# Patient Record
Sex: Male | Born: 1975 | Race: White | Hispanic: No | Marital: Married | Smoking: Former smoker
Health system: Southern US, Community
[De-identification: ages and names within clinical notes are randomized; demographics above are authoritative.]

## PROBLEM LIST (undated history)

## (undated) ENCOUNTER — Ambulatory Visit: Admission: EM | Payer: BC Managed Care – PPO | Source: Home / Self Care

## (undated) HISTORY — PX: NASAL SINUS SURGERY: SHX719

## (undated) HISTORY — PX: KNEE SURGERY: SHX244

---

## 2007-03-18 ENCOUNTER — Ambulatory Visit: Payer: Self-pay | Admitting: Emergency Medicine

## 2007-03-28 ENCOUNTER — Ambulatory Visit: Payer: Self-pay | Admitting: Emergency Medicine

## 2007-10-24 ENCOUNTER — Ambulatory Visit: Payer: Self-pay | Admitting: Internal Medicine

## 2007-10-25 ENCOUNTER — Ambulatory Visit: Payer: Self-pay | Admitting: Internal Medicine

## 2007-12-26 ENCOUNTER — Other Ambulatory Visit: Payer: Self-pay

## 2007-12-26 ENCOUNTER — Ambulatory Visit: Payer: Self-pay | Admitting: Family Medicine

## 2008-02-17 ENCOUNTER — Ambulatory Visit: Payer: Self-pay | Admitting: Family Medicine

## 2008-05-20 IMAGING — CR DG CHEST 2V
1 series · 2 of 2 positions shown · non-contrast
Comparison: none

REASON FOR EXAM: chest pain
COMMENTS:

[Series 1: view not recorded · 0.17mm/px · 2 of 2 slices shown]
[im 1/2]
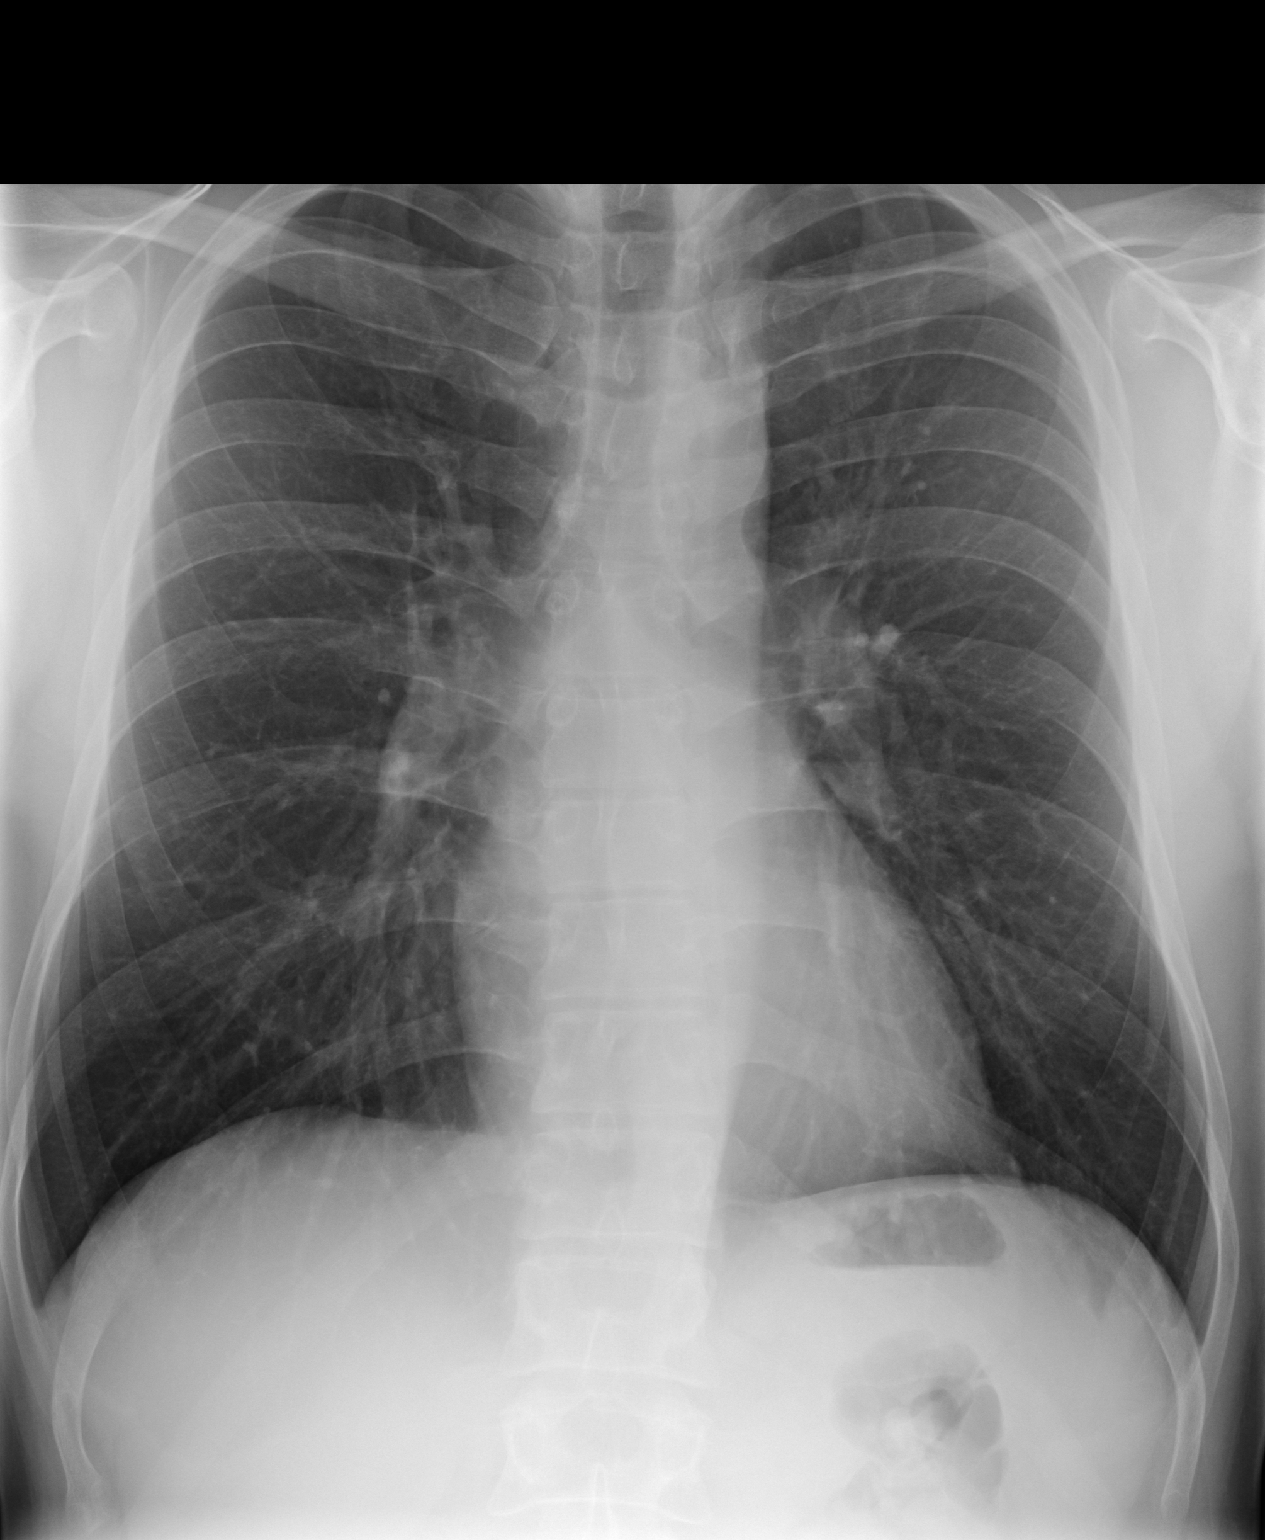
[im 2/2]
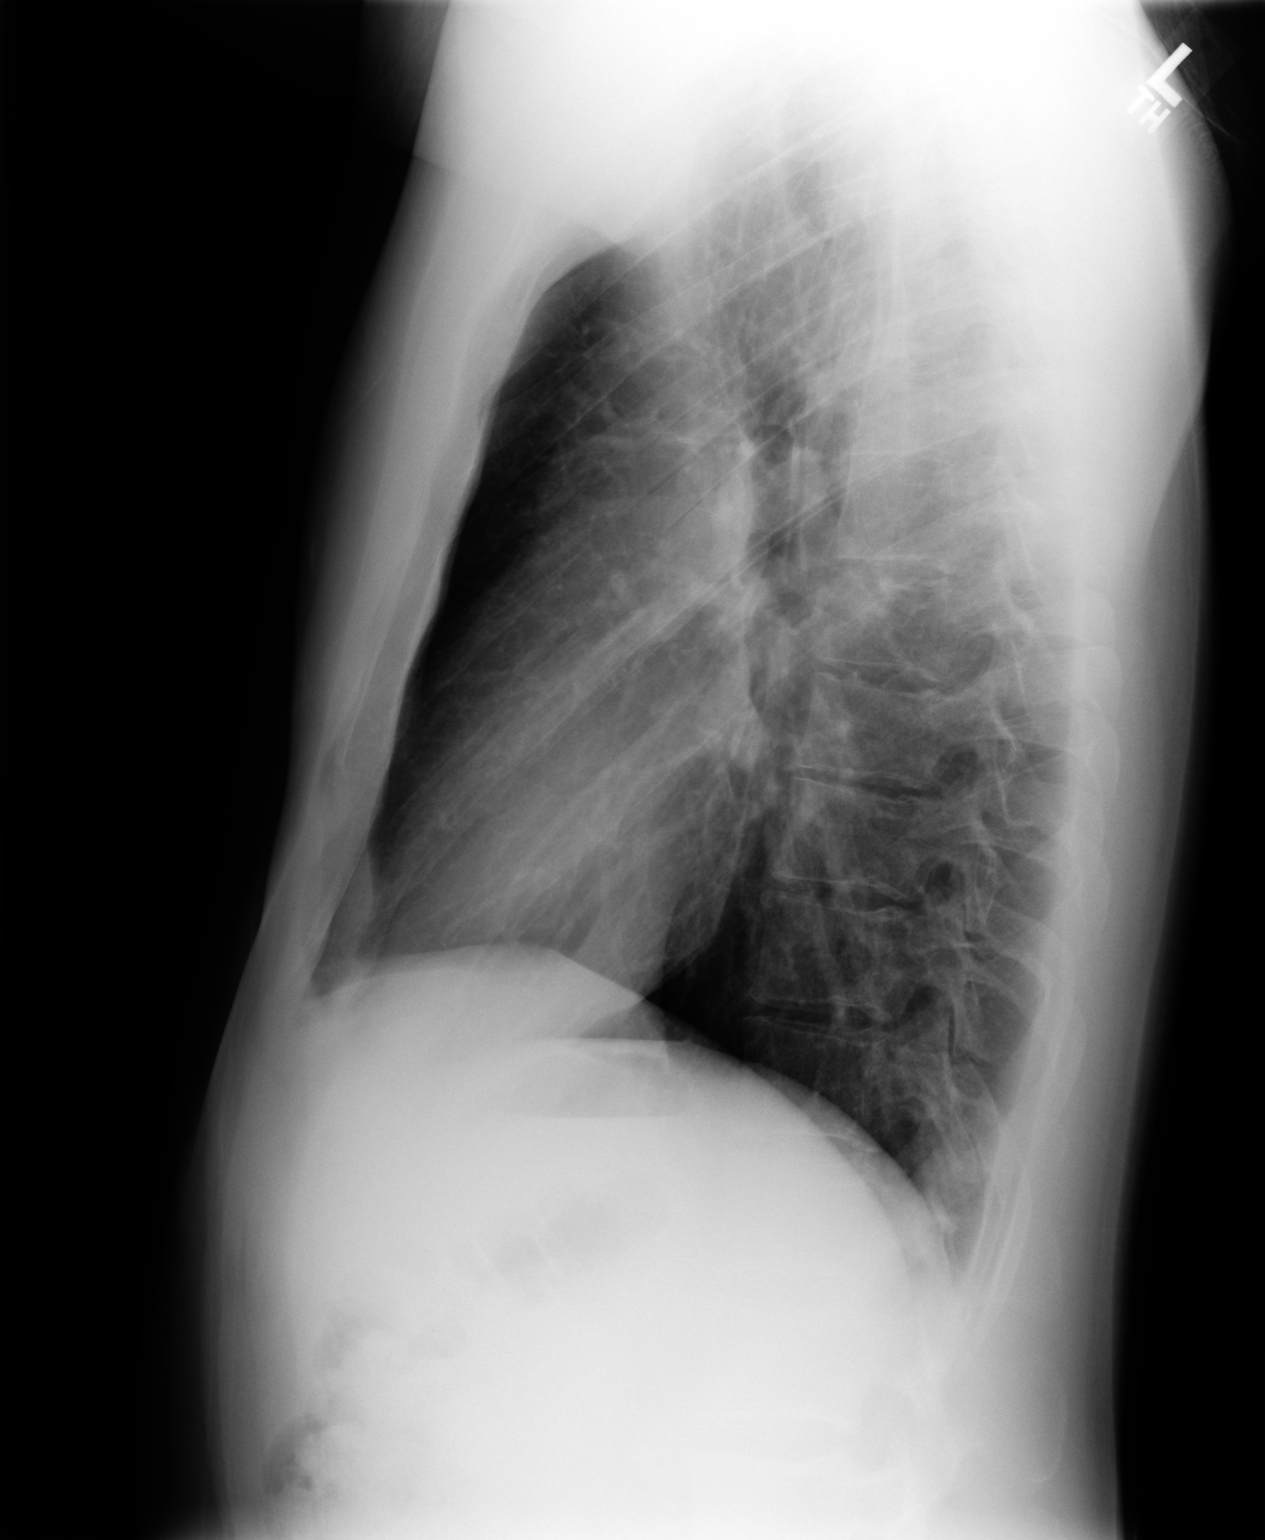

[2 of 2 positions shown; findings below may reference images not displayed]

PROCEDURE:     MDR - MDR CHEST PA(OR AP) AND LATERAL  - December 26, 2007  [DATE]

RESULT:     Comparison is made to the prior exam of 03/28/2007. No pneumonia,
pneumothorax or pleural effusion is seen. The heart size is normal. The
mediastinal and osseous structures show no significant abnormalities.
IMPRESSION: No significant abnormalities are noted.

## 2008-10-09 ENCOUNTER — Ambulatory Visit: Payer: Self-pay | Admitting: Urology

## 2009-01-22 IMAGING — CT CT ABD-PELV W/O CM
1 of 2 series · 15 of 32 positions shown, 19 images · non-contrast
Comparison: none

REASON FOR EXAM: abdominal pain
COMMENTS:

[Series 2: soft tissue · axial · 0.77mm/px · z∈[-252,+195]mm · 15 of 165 slices shown, 19 images]
[im 8/165  soft-tissue]
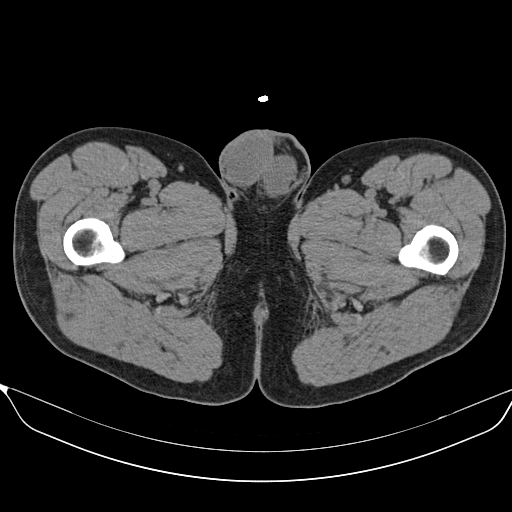
[im 8/165  bone]
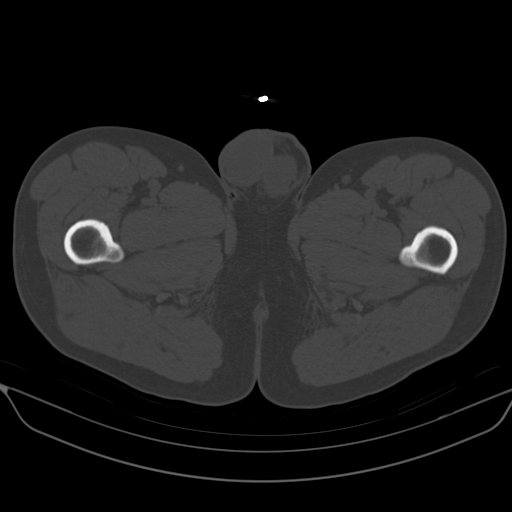
[im 22/165  soft-tissue]
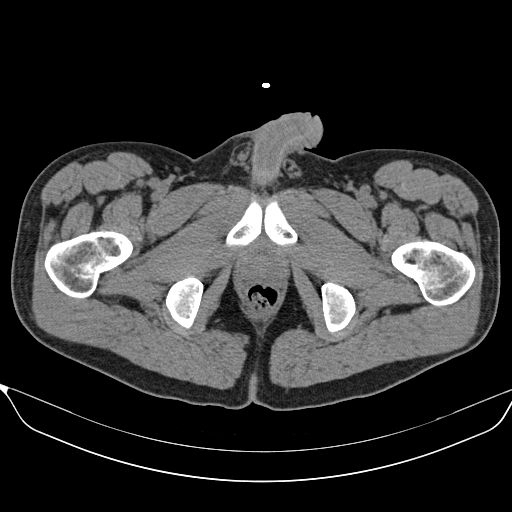
[im 36/165  soft-tissue]
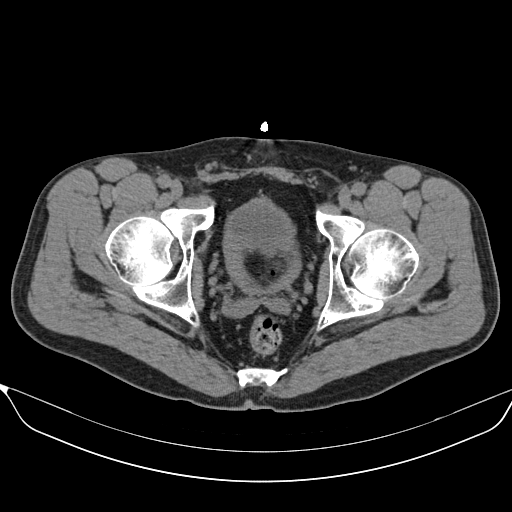
[im 43/165  soft-tissue]
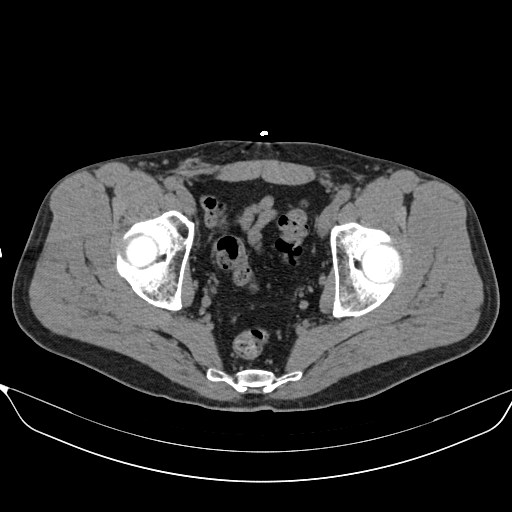
[im 58/165  soft-tissue]
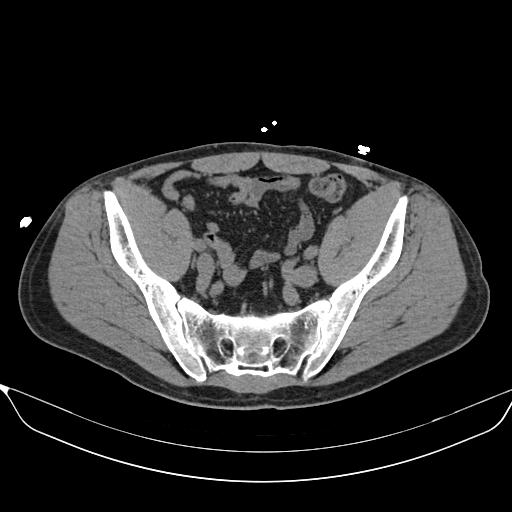
[im 72/165  soft-tissue]
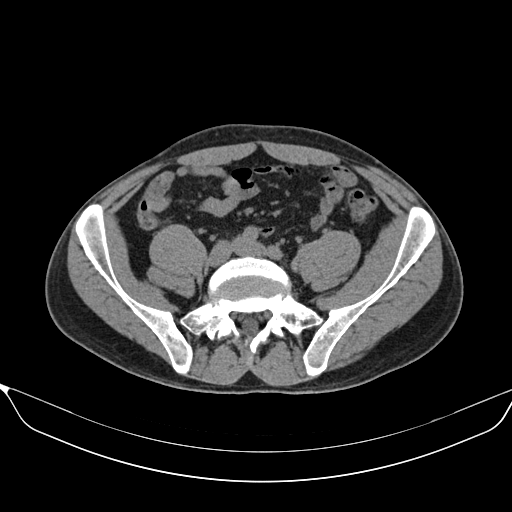
[im 86/165  soft-tissue]
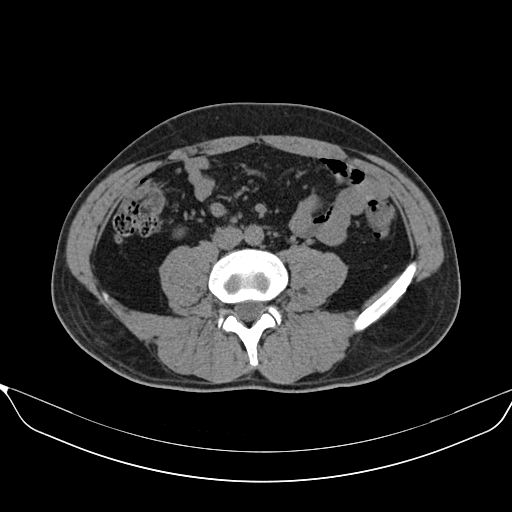
[im 93/165  soft-tissue]
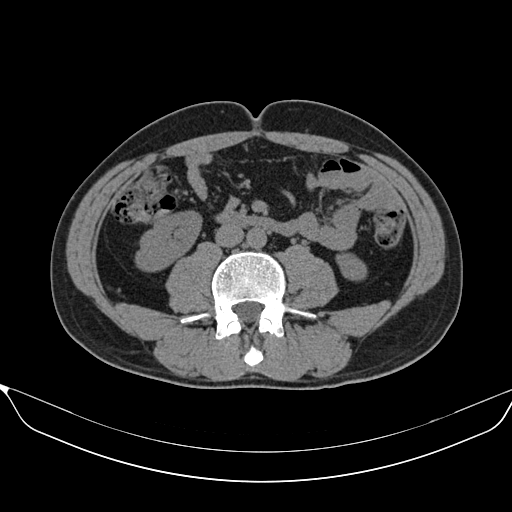
[im 107/165  soft-tissue]
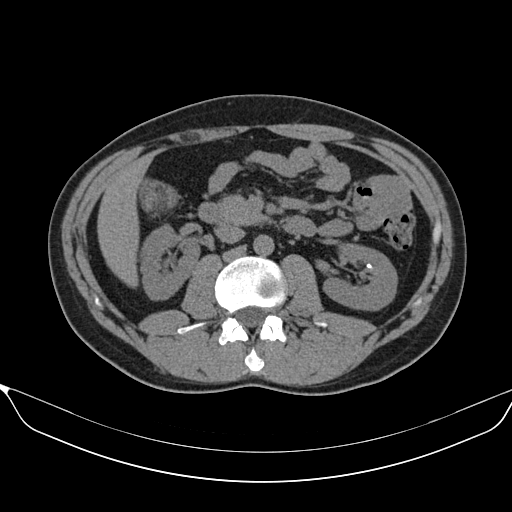
[im 107/165  bone]
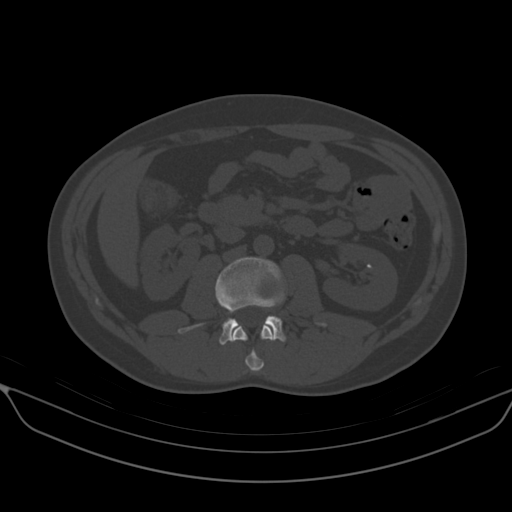
[im 122/165  soft-tissue]
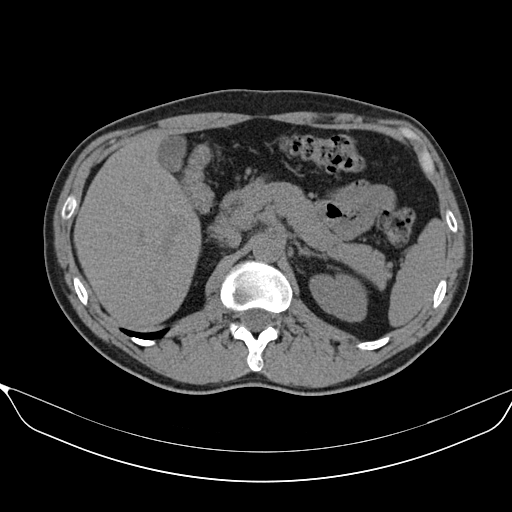
[im 129/165  soft-tissue]
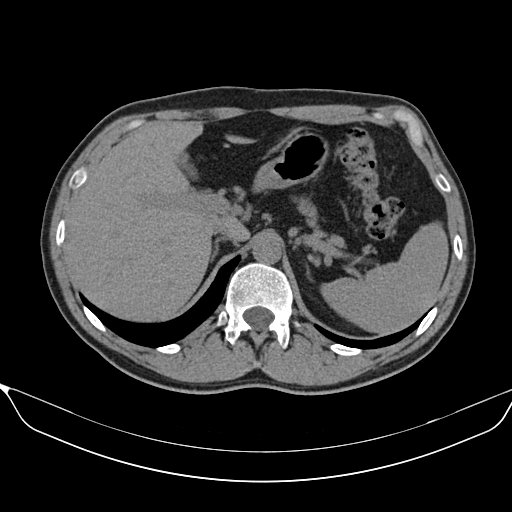
[im 136/165  lung]
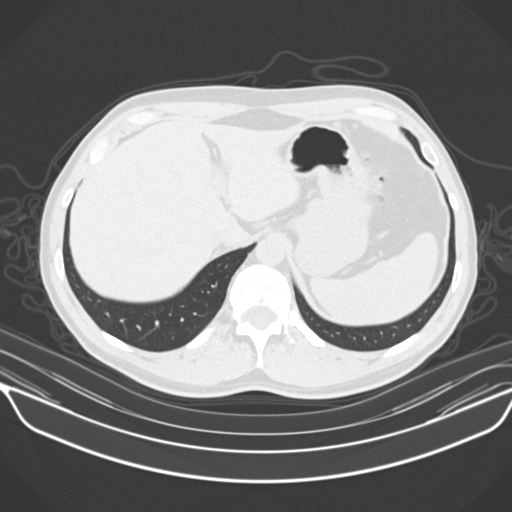
[im 143/165  soft-tissue]
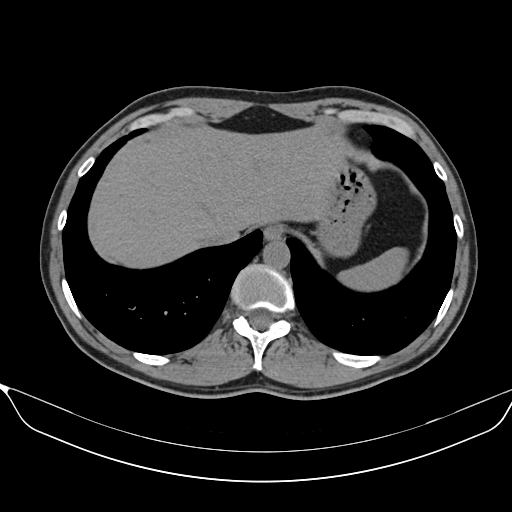
[im 143/165  lung]
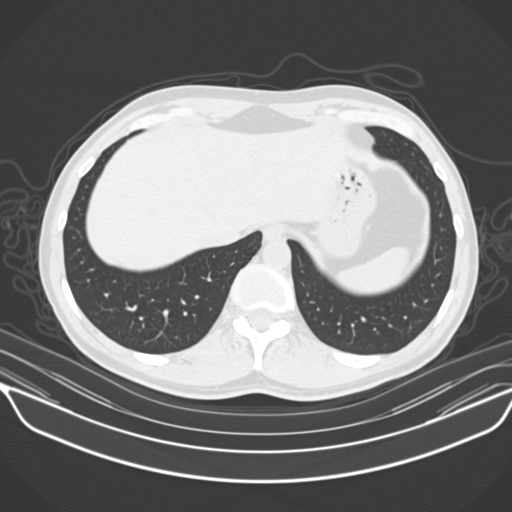
[im 150/165  lung]
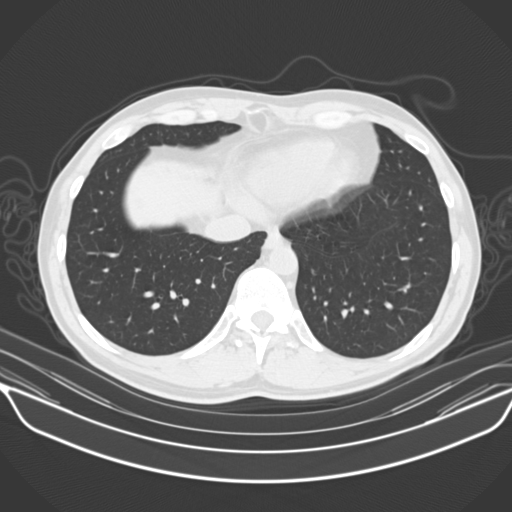
[im 157/165  soft-tissue]
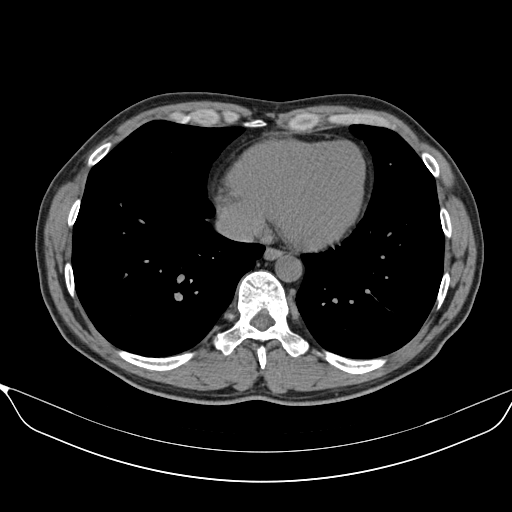
[im 157/165  lung]
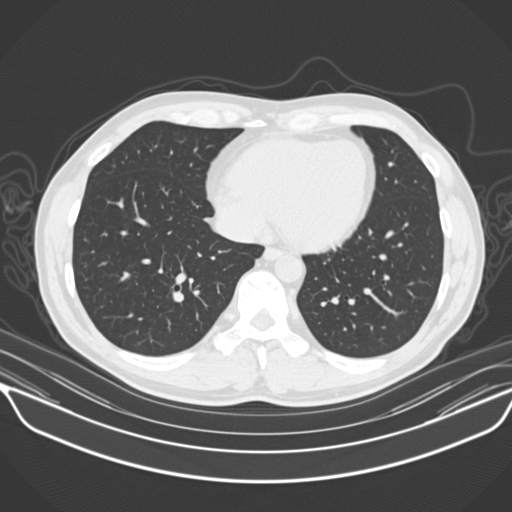

[15 of 32 positions shown; findings below may reference images not displayed]

PROCEDURE:     CEOLA - CEOLA ABDOMEN AND PELVIS WO  - October 09, 2008  [DATE]

RESULT:     The patient is undergoing evaluation for LEFT flank and pelvic
discomfort. Non-contrast CT scanning was performed through the abdomen and
pelvis at 3 mm intervals and slice thicknesses at soft tissue and lung
windows. Review of three dimensional reconstructed images was performed
separately on the Webspace server monitor. A comparison study from 25 October, 2007 is reviewed. On the prior study the patient was found to have
kidney stones on the LEFT and had a distal ureteral calculus at that time.
On today's study in the upper pole of the LEFT kidney there is an
approximately 2 mm diameter stone. In the midpole there is an approximately
4 mm diameter stone and in the lower pole there is an approximately 5 mm
diameter stone with an adjacent approximately 2 mm diameter stone. There is
no evidence of obstruction of the LEFT renal collecting system currently. No
ureteral stone is seen. On the RIGHT, there is a punctate lower pole
nonobstructing stone identified. Along the expected course of the ureters I
see no abnormal soft tissue masses. The urinary bladder is partially
distended and grossly normal.

Within the inguinal region I see no hernia. The sigmoid colon and rectum are
normal in appearance for the noncontrast study. The prostate gland and
seminal vesicles are normal in appearance for age. No pelvic nor inguinal
lymphadenopathy is identified.

The liver, gallbladder, spleen, stomach, pancreas, and adrenal glands are
normal in appearance for the noncontrast study. The caliber of the abdominal
aorta is normal. The lung bases are clear. The lumbar vertebral bodies are
preserved in height.
IMPRESSION: 1. There are bilateral kidney stones. There is no evidence of obstruction
currently. The perinephric fat is normal in density.
2. I do not see evidence of diverticulitis or other acute bowel abnormality.
There is no evidence of an inguinal hernia.
3. I see no abnormality elsewhere within the abdomen or pelvis.

## 2009-04-07 ENCOUNTER — Ambulatory Visit: Payer: Self-pay | Admitting: Internal Medicine

## 2009-10-19 ENCOUNTER — Ambulatory Visit: Payer: Self-pay | Admitting: Internal Medicine

## 2015-10-20 ENCOUNTER — Ambulatory Visit
Admission: EM | Admit: 2015-10-20 | Discharge: 2015-10-20 | Disposition: A | Discharge status: Home / Self Care | Payer: 59 | Attending: Family Medicine | Admitting: Family Medicine

## 2015-10-20 ENCOUNTER — Encounter: Payer: Self-pay | Admitting: Emergency Medicine

## 2015-10-20 DIAGNOSIS — R490 Dysphonia: Secondary | ICD-10-CM

## 2015-10-20 DIAGNOSIS — H6981 Other specified disorders of Eustachian tube, right ear: Secondary | ICD-10-CM

## 2015-10-20 LAB — RAPID STREP SCREEN (MED CTR MEBANE ONLY): Streptococcus, Group A Screen (Direct): NEGATIVE

## 2015-10-20 MED ORDER — AMOXICILLIN-POT CLAVULANATE 875-125 MG PO TABS: 1.0000 | ORAL_TABLET | Freq: Two times a day (BID) | ORAL | Status: DC

## 2015-10-20 NOTE — ED Provider Notes (Signed)
CSN: [id number]     Arrival date & time 10/20/15  1010 History   First MD Initiated Contact with Patient 10/20/15 1233   Nurses notes were reviewed.  Chief Complaint  Patient presents with  . Otalgia  . Sore Throat    Patient reports pain right ear and pain in his right side of his throat as well. States he's had sinus surgery was more worse some is that he states that he's had difficulty with hoarseness for over 2 months now. He saw his PCP several weeks ago placed him on a nasal spray. He does not know the name of the nasal spray. He is allergic to Sudafed. And antihistamines just dried him up which is not unexpected.  (Consider location/radiation/quality/duration/timing/severity/associated sxs/prior Treatment) Patient is a 40 y.o. male presenting with ear pain. The history is provided by the patient. No language interpreter was used.  Otalgia Location:  Right Behind ear:  No abnormality Quality:  Aching and pressure Severity:  Moderate Onset quality:  Sudden Timing:  Constant Progression:  Worsening Chronicity:  New Context: foreign body   Context: not direct blow, not elevation change, not loud noise and no water in ear   Relieved by:  Nothing Worsened by:  Nothing tried Associated symptoms: no rhinorrhea and no sore throat     History reviewed. No pertinent past medical history. Past Surgical History  Procedure Laterality Date  . Nasal sinus surgery    . Knee surgery     History reviewed. No pertinent family history. Social History  Substance Use Topics  . Smoking status: Former Games developermoker  . Smokeless tobacco: Current User  . Alcohol Use: No    Review of Systems  HENT: Positive for ear pain. Negative for rhinorrhea and sore throat.   All other systems reviewed and are negative.   Allergies  Morphine and related  Home Medications   Prior to Admission medications   Medication Sig Start Date End Date Taking? Authorizing Provider  amoxicillin-clavulanate  (AUGMENTIN) 875-125 MG tablet Take 1 tablet by mouth 2 (two) times daily. 10/20/15   Justin RowanEugene Keyondre Hepburn, MD   Meds Ordered and Administered this Visit  Medications - No data to display  BP 126/94 mmHg  Pulse 61  Temp(Src) 97.8 F (36.6 C) (Oral)  Resp 16  Ht 6\' 4"  (1.93 m)  Wt 198 lb (89.812 kg)  BMI 24.11 kg/m2  SpO2 100% No data found.   Physical Exam  Constitutional: He is oriented to person, place, and time. He appears well-nourished.  HENT:  Head: Normocephalic and atraumatic.  Right Ear: Tympanic membrane is bulging. Tympanic membrane is not erythematous and not retracted.  Mouth/Throat: Posterior oropharyngeal erythema present.  Patient had tenderness behind the right ear along the right mandible area.  Eyes: Pupils are equal, round, and reactive to light.  Neck: Normal range of motion. Neck supple. No tracheal deviation present. No thyromegaly present.  Neurological: He is alert and oriented to person, place, and time.  Skin: Skin is warm and dry.  Vitals reviewed.   ED Course  Procedures (including critical care time)  Labs Review Labs Reviewed  RAP[facility] SCREEN ([facility])  CULTURE, GROUP A STREP [facility])    Imaging Review No results found.   Visual Acuity Review  Right Eye Distance:   Left Eye Distance:   Bilateral Distance:    Right Eye Near:   Left Eye Near:    Bilateral Near:         MDM  1. Eustachian tube dysfunction, right   2. Persistent hoarseness    Concern over the persistent hoarseness. Explained patient because of persistent hoarseness I'm going to recommend that we place him on Augmentin 8 7510 days. Normally we would place him also on Allegra-D but he is allergic to Sudafed. He has since steroid nasal spray one over the proper way to use it but stress that if he is not improved and his voice is not improved and hoarseness is not disappeared he needs to see ENT of his choice or at least his PCP for probable referral. He declines work  note for today.    Justin Rowan, MD 10/20/15 [phone]

## 2015-10-20 NOTE — Discharge Instructions (Signed)
Hoarseness Hoarseness is any abnormal change in your voice.Hoarseness can make it difficult to speak. Your voice may sound raspy, breathy, or strained. Hoarseness is caused by a problem with the vocal cords. The vocal cords are two bands of tissue inside your voice box (larynx). When you speak, your vocal cords move back and forth to create sound. The surfaces of your vocal cords need to be smooth for your voice to sound clear. Swelling or lumps on the vocal cords can cause hoarseness. Common causes of vocal cord problems include:  Upper airway infection.  A long-term cough.  Straining or overusing your voice.  Smoking.  Allergies.  Vocal cord growths.  Stomach acids that flow up from your stomach and irritate your vocal cords (gastroesophageal reflux). HOME CARE INSTRUCTIONS Watch your condition for any changes. To ease any discomfort that you feel:  Rest your voice. Do not whisper. Whispering can cause muscle strain.  Do not speak in a loud or harsh voice that makes your hoarseness worse.  Do not use any tobacco products, including cigarettes, chewing tobacco, or electronic cigarettes. If you need help quitting, ask your health care provider.  Avoid secondhand smoke.  Do not eat foods that give you heartburn. Heartburn can make gastroesophageal reflux worse.  Do not drink coffee.  Do not drink alcohol.  Drink enough fluids to keep your urine clear or pale yellow.  Use a humidifier if the air in your home is dry. SEEK MEDICAL CARE IF:  You have hoarseness that lasts longer than 3 weeks.  You almost lose or completelylose your voice for longer than 3 days.  You have pain when you swallow or try to talk.  You feel a lump in your neck. SEEK IMMEDIATE MEDICAL CARE IF:  You have trouble swallowing.  You feel as though you are choking when you swallow.  You cough up blood or vomit blood.  You have trouble breathing.   This information is not intended to replace  advice given to you by your health care provider. Make sure you discuss any questions you have with your health care provider.   Document Released: 09/03/2005 Document Revised: 02/04/2015 Document Reviewed: 09/11/2014 Elsevier Interactive Patient Education 2016 [employer] Barotitis media is inflammation of your middle ear. This occurs when the auditory tube (eustachian tube) leading from the back of your nose (nasopharynx) to your eardrum is blocked. This blockage may result from a cold, environmental allergies, or an upper respiratory infection. Unresolved barotitis media may lead to damage or hearing loss (barotrauma), which may become permanent. HOME CARE INSTRUCTIONS   Use medicines as recommended by your health care provider. Over-the-counter medicines will help unblock the canal and can help during times of air travel.  Do not put anything into your ears to clean or unplug them. Eardrops will not be helpful.  Do not swim, dive, or fly until your health care provider says it is all right to do so. If these activities are necessary, chewing gum with frequent, forceful swallowing may help. It is also helpful to hold your nose and gently blow to pop your ears for equalizing pressure changes. This forces air into the eustachian tube.  Only take over-the-counter or prescription medicines for pain, discomfort, or fever as directed by your health care provider.  A decongestant may be helpful in decongesting the middle ear and make pressure equalization easier. SEEK MEDICAL CARE IF:  You experience a serious form of dizziness in which you feel as if the  room is spinning and you feel nauseated (vertigo).  Your symptoms only involve one ear. SEEK IMMEDIATE MEDICAL CARE IF:   You develop a severe headache, dizziness, or severe ear pain.  You have bloody or pus-like drainage from your ears.  You develop a fever.  Your problems do not improve or become worse. MAKE SURE YOU:    Understand these instructions.  Will watch your condition.  Will get help right away if you are not doing well or get worse.   This information is not intended to replace advice given to you by your health care provider. Make sure you discuss any questions you have with your health care provider.   Document Released: 09/17/2000 Document Revised: 07/11/2013 Document Reviewed: 04/17/2013 Elsevier Interactive Patient Education [employer]

## 2015-10-20 NOTE — ED Notes (Signed)
Patient c/o right ear pain and sore throat that started yesterday.  Patient reports sinus problems for over a month.

## 2015-10-22 LAB — CULTURE, GROUP A STREP (THRC)

## 2015-10-23 ENCOUNTER — Telehealth: Payer: Self-pay | Admitting: *Deleted

## 2015-10-23 NOTE — ED Notes (Signed)
Called patient and informed him that his strep culture came back negative. Patient is still having throat pain and has an appointment with his PCP tomorrow.

## 2021-09-05 ENCOUNTER — Other Ambulatory Visit: Payer: Self-pay

## 2023-06-08 ENCOUNTER — Ambulatory Visit
Admission: EM | Admit: 2023-06-08 | Discharge: 2023-06-08 | Disposition: A | Discharge status: Home / Self Care | Payer: BC Managed Care – PPO | Attending: Family Medicine | Admitting: Family Medicine

## 2023-06-08 ENCOUNTER — Ambulatory Visit (INDEPENDENT_AMBULATORY_CARE_PROVIDER_SITE_OTHER): Payer: BC Managed Care – PPO

## 2023-06-08 DIAGNOSIS — R6889 Other general symptoms and signs: Secondary | ICD-10-CM | POA: Insufficient documentation

## 2023-06-08 DIAGNOSIS — Z1152 Encounter for screening for COVID-19: Secondary | ICD-10-CM | POA: Diagnosis not present

## 2023-06-08 DIAGNOSIS — R509 Fever, unspecified: Secondary | ICD-10-CM | POA: Insufficient documentation

## 2023-06-08 DIAGNOSIS — R059 Cough, unspecified: Secondary | ICD-10-CM | POA: Diagnosis not present

## 2023-06-08 DIAGNOSIS — J189 Pneumonia, unspecified organism: Secondary | ICD-10-CM | POA: Insufficient documentation

## 2023-06-08 LAB — RAPID INFLUENZA A&B ANTIGENS
Influenza A (ARMC): NEGATIVE
Influenza B (ARMC): NEGATIVE

## 2023-06-08 LAB — SARS CORONAVIRUS 2 BY RT PCR: SARS Coronavirus 2 by RT PCR: NEGATIVE

## 2023-06-08 MED ORDER — ALBUTEROL SULFATE HFA 108 (90 BASE) MCG/ACT IN AERS: 2.0000 | INHALATION_SPRAY | RESPIRATORY_TRACT | 0 refills | Status: AC | PRN

## 2023-06-08 MED ORDER — AMOXICILLIN-POT CLAVULANATE 875-125 MG PO TABS: 1.0000 | ORAL_TABLET | Freq: Two times a day (BID) | ORAL | 0 refills | Status: AC

## 2023-06-08 MED ORDER — AZITHROMYCIN 250 MG PO TABS: ORAL_TABLET | ORAL | 0 refills | Status: AC

## 2023-06-08 NOTE — Discharge Instructions (Addendum)
Your COVID and flu test were negative.  Your chest x-ray however showed a pneumonia in the right middle and lower lobes.  Stop by the pharmacy to pick up your prescriptions.  Follow up with your primary care provider as needed.

## 2023-06-08 NOTE — ED Provider Notes (Signed)
[facility]    CSN: [id number] Arrival date & time: 06/08/23  4098      History   Chief Complaint Chief Complaint  Patient presents with   Fever   Chills    HPI Justin Blanchard is a 47 y.o. male.   HPI  History obtained from the patient. Justin Blanchard presents for fever, chills.  Three weeks ago started having sinus problem but that mostly went away. Has facial pain, headache and slight nasal congestion. Has been having a cough for over a week.  On Monday, started having chills, fever, fatigue, body aches,  headache, nasal congestion,, diarrhea and sleep disturbance. Tmax 101.3 F.  No recent sick contacts. Took Execedrin migraine for headache. Taking Zicam for nasal congestion.  Has hoarseness mostly at night.  Monday, he had a tick stuck to him and another two where crawling on him. Works outside often.     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     History reviewed. No pertinent past medical history.  There are no problems to display for this patient.   Past Surgical History:  Procedure Laterality Date   KNEE SURGERY     NASAL SINUS SURGERY         Home Medications    Prior to Admission medications   Medication Sig Start Date End Date Taking? Authorizing Provider  albuterol (VENTOLIN HFA) 108 (90 Base) MCG/ACT inhaler Inhale 2 puffs into the lungs every 4 (four) hours as needed. 06/08/23  Yes Justin Gerding, DO  azithromycin (ZITHROMAX Z-PAK) 250 MG tablet Take 2 tablets on day 1 then 1 tablet daily 06/08/23  Yes Justin Ellerson, DO  amoxicillin-clavulanate (AUGMENTIN) 875-125 MG tablet Take 1 tablet by mouth 2 (two) times daily. 06/08/23   Justin Cabal, DO    Family History History reviewed. No pertinent family history.  Social History Social History   Tobacco Use   Smoking status: Former   Smokeless tobacco: Current  Substance Use Topics   Alcohol use: No     Allergies   Morphine and codeine, Pseudoephedrine hcl, and Morphine   Review of  Systems Review of Systems: negative unless otherwise stated in HPI.      Physical Exam Triage Vital Signs ED Triage Vitals  Encounter Vitals Group     BP 06/08/23 0959 (!) 139/96     Systolic BP Percentile --      Diastolic BP Percentile --      Pulse Rate 06/08/23 0959 (!) 106     Resp 06/08/23 0959 16     Temp 06/08/23 0959 99.3 F (37.4 C)     Temp Source 06/08/23 0959 Oral     SpO2 06/08/23 0959 99 %     Weight 06/08/23 0958 190 lb (86.2 kg)     Height 06/08/23 0958 6\' 3"  (1.905 m)     Head Circumference --      Peak Flow --      Pain Score 06/08/23 1001 5     Pain Loc --      Pain Education --      Exclude from Growth Chart --    No data found.  Updated Vital Signs BP (!) 139/96 (BP Location: Left Arm)   Pulse (!) 106   Temp 99.3 F (37.4 C) (Oral)   Resp 16   Ht 6\' 3"  (1.905 m)   Wt 86.2 kg   SpO2 99%   BMI 23.75 kg/m   Visual Acuity Right Eye Distance:   Left  Eye Distance:   Bilateral Distance:    Right Eye Near:   Left Eye Near:    Bilateral Near:     Physical Exam GEN:     alert, ill but non-toxic appearing male in no distress    HENT:  mucus membranes moist, oropharyngeal without lesions, mild erythema, no tonsillar hypertrophy or exudates,  moderate erythematous edematous turbinates, clear nasal discharge EYES:   pupils equal and reactive, no scleral injection or discharge NECK:  normal ROM, no lymphadenopathy, no meningismus   RESP:  no increased work of breathing, rales on right mid to lower lung  CVS:   Regular rhythm, tachycardic Skin:   warm and dry, no rash on visible skin    UC Treatments / Results  Labs (all labs ordered are listed, but only abnormal results are displayed) Labs Reviewed  SARS CORONAVIRUS 2 BY RT PCR  RAPID INFLUENZA A&B ANTIGENS    EKG   Radiology DG Chest 2 View  Result Date: 06/08/2023 CLINICAL DATA:  Cough and fever. EXAM: CHEST - 2 VIEW COMPARISON:  None Available. FINDINGS: Patchy airspace disease in  the right middle and anterior lower lobe typical of pneumonia. The left lung is clear. The heart is normal in size. Normal pulmonary vasculature. No pleural fluid or pneumothorax. No acute osseous findings. IMPRESSION: Patchy airspace disease in the right middle and anterior lower lobe typical of pneumonia. Followup PA and lateral chest X-ray is recommended in 3-4 weeks following trial of antibiotic therapy to ensure resolution and exclude underlying malignancy. Electronically Signed   By: Narda Rutherford M.D.   On: 06/08/2023 11:46    Procedures Procedures (including critical care time)  Medications Ordered in UC Medications - No data to display  Initial Impression / Assessment and Plan / UC Course  I have reviewed the triage vital signs and the nursing notes.  Pertinent labs & imaging results that were available during my care of the patient were reviewed by me and considered in my medical decision making (see chart for details).       Pt is a 47 y.o. male who presents for fever and chills with respiratory and GI symptoms. Justin Blanchard has an elevated temperature here of 99.4F but took recent antipyretics.  He is also tachycardic.  Afebrile here without recent antipyretics. Satting well on room air. Overall pt is ill but non-toxic appearing, well hydrated, without respiratory distress. Pulmonary exam is remarkable for mid to lower right lung rales.  COVID testing obtained and was negative.   He has flulike symptoms.  Influenza testing obtained and was negative.  Chest x-ray personally reviewed by me with questionable focal pneumonia in the area of concern but no pleural effusion, cardiomegaly or pneumothorax.  Neurologist impression reviewed and notes right mid to lower lung patchy airspace disease concerning for pneumonia.  Treat with dual antibiotics as below.  Albuterol inhaler for cough and bronchospasm.  Discussed symptomatic treatment.  Typical duration of symptoms discussed.   Return and ED  precautions given and voiced understanding. Discussed MDM, treatment plan and plan for follow-up with patient who agrees with plan.     Final Clinical Impressions(s) / UC Diagnoses   Final diagnoses:  Pneumonia of right lower lobe due to infectious organism  Fever, unspecified  Flu-like symptoms     Discharge Instructions      Your COVID and flu test were negative.  Your chest x-ray however showed a pneumonia in the right middle and lower lobes.  Stop by the pharmacy  to pick up your prescriptions.  Follow up with your primary care provider as needed.      ED Prescriptions     Medication Sig Dispense Auth. Provider   amoxicillin-clavulanate (AUGMENTIN) 875-125 MG tablet Take 1 tablet by mouth 2 (two) times daily. 14 tablet Finnian Husted, DO   azithromycin (ZITHROMAX Z-PAK) 250 MG tablet Take 2 tablets on day 1 then 1 tablet daily 6 tablet Jojo Pehl, DO   albuterol (VENTOLIN HFA) 108 (90 Base) MCG/ACT inhaler Inhale 2 puffs into the lungs every 4 (four) hours as needed. 6.7 g Justin Cabal, DO      PDMP not reviewed this encounter.   Justin Cabal, DO 06/08/23 1758

## 2023-06-08 NOTE — ED Triage Notes (Signed)
Pt c/o fever,bodyaches, & chills x2 days. Tmax 101.3 yesterday. Has tried tylenol & motrin w/o relief.

## 2023-06-16 ENCOUNTER — Other Ambulatory Visit: Payer: Self-pay | Admitting: Family Medicine

## 2023-06-16 DIAGNOSIS — J189 Pneumonia, unspecified organism: Secondary | ICD-10-CM

## 2023-08-22 ENCOUNTER — Ambulatory Visit
Admission: RE | Admit: 2023-08-22 | Discharge: 2023-08-22 | Disposition: A | Discharge status: Home / Self Care | Payer: BC Managed Care – PPO | Attending: Family Medicine | Admitting: Family Medicine

## 2023-08-22 ENCOUNTER — Ambulatory Visit
Admission: RE | Admit: 2023-08-22 | Discharge: 2023-08-22 | Disposition: A | Discharge status: Home / Self Care | Payer: BC Managed Care – PPO | Source: Ambulatory Visit | Attending: Family Medicine | Admitting: Family Medicine

## 2023-08-22 DIAGNOSIS — J189 Pneumonia, unspecified organism: Secondary | ICD-10-CM | POA: Insufficient documentation
